# Patient Record
Sex: Female | Born: 1990 | Race: White | Hispanic: No | Marital: Single
Health system: Midwestern US, Academic
[De-identification: ages and names within clinical notes are randomized; demographics above are authoritative.]

---

## 2018-12-03 ENCOUNTER — Encounter: Admit: 2018-12-03 | Discharge: 2018-12-03

## 2018-12-03 DIAGNOSIS — R748 Abnormal levels of other serum enzymes: Secondary | ICD-10-CM

## 2018-12-03 DIAGNOSIS — K7469 Other cirrhosis of liver: Secondary | ICD-10-CM

## 2018-12-03 DIAGNOSIS — R1011 Right upper quadrant pain: Secondary | ICD-10-CM

## 2018-12-03 DIAGNOSIS — K7290 Hepatic failure, unspecified without coma: Secondary | ICD-10-CM

## 2018-12-03 LAB — OPIATES-URINE RANDOM: Lab: NEGATIVE

## 2018-12-03 LAB — CBC AND DIFF
Lab: 0 10*3/uL (ref 0–0.20)
Lab: 0.1 10*3/uL (ref 0–0.45)
Lab: 0.6 10*3/uL (ref 0–0.80)
Lab: 2.1 10*3/uL (ref 1.8–7.0)
Lab: 3 % (ref 0–5)
Lab: 37 % — ABNORMAL HIGH (ref 36–45)
Lab: 4.4 M/UL — ABNORMAL LOW (ref 4.0–5.0)
Lab: 41 % (ref 24–44)
Lab: 5.1 10*3/uL — ABNORMAL HIGH (ref 4.5–11.0)

## 2018-12-03 LAB — AMPHETAMINES-URINE RANDOM: Lab: POSITIVE — AB

## 2018-12-03 LAB — LDH-LACTATE DEHYDROGENASE: Lab: 293 U/L — ABNORMAL HIGH (ref 100–210)

## 2018-12-03 LAB — ALCOHOL LEVEL: Lab: <10 mg/dL

## 2018-12-03 LAB — PROTIME INR (PT): Lab: 1.1 % (ref 0.8–1.2)

## 2018-12-03 LAB — BENZODIAZEPINES-URINE RANDOM: Lab: POSITIVE — AB

## 2018-12-03 LAB — HEPATITIS B CORE AB TOT (IGG+IGM)

## 2018-12-03 LAB — ACETAMINOPHEN LEVEL: Lab: <10 ug/mL (ref <20.1)

## 2018-12-03 LAB — MAGNESIUM: Lab: 1.7 mg/dL — ABNORMAL LOW (ref >40)

## 2018-12-03 LAB — HEPATITIS PANEL, ACUTE

## 2018-12-03 LAB — COMPREHENSIVE METABOLIC PANEL
Lab: 0.5 mg/dL (ref 0.4–1.00)
Lab: 110 mg/dL — ABNORMAL HIGH (ref 70–100)
Lab: 136 MMOL/L — ABNORMAL LOW (ref 137–147)
Lab: 4 MMOL/L (ref 3.5–5.1)
Lab: 6.2 g/dL (ref 6.0–8.0)

## 2018-12-03 LAB — LIPID PROFILE
Lab: 121 mg/dL (ref 7–11)
Lab: 130 mg/dL (ref <200)
Lab: 145 mg/dL — ABNORMAL HIGH (ref <150)
Lab: 82 mg/dL — ABNORMAL HIGH (ref <100)

## 2018-12-03 LAB — CANNABINOIDS-URINE RANDOM: Lab: NEGATIVE

## 2018-12-03 LAB — PHENCYCLIDINES-URINE RANDOM: Lab: NEGATIVE

## 2018-12-03 LAB — URINALYSIS DIPSTICK
Lab: NEGATIVE
Lab: NEGATIVE
Lab: NEGATIVE
Lab: POSITIVE — AB

## 2018-12-03 LAB — HEMOGLOBIN A1C: Lab: 5.7 % (ref 4.0–6.0)

## 2018-12-03 LAB — SALICYLATE LEVEL

## 2018-12-03 LAB — BILIRUBIN, DIRECT: Lab: 3 mg/dL — ABNORMAL HIGH (ref <0.4)

## 2018-12-03 LAB — PHOSPHORUS: Lab: 3.2 mg/dL (ref >60)

## 2018-12-03 LAB — HIV 1& 2 AG-AB SCRN W REFLEX HIV 1 PCR QUANT

## 2018-12-03 LAB — BARBITURATES-URINE RANDOM: Lab: NEGATIVE

## 2018-12-03 LAB — COVID-19 (SARS-COV-2) PCR

## 2018-12-03 LAB — HEPATITIS A TOTAL AB (IGG+IGM)

## 2018-12-03 LAB — COCAINE-URINE RANDOM: Lab: NEGATIVE

## 2018-12-03 LAB — URINALYSIS, MICROSCOPIC

## 2018-12-03 LAB — TSH WITH FREE T4 REFLEX: Lab: 1.9 uU/mL (ref 0.35–5.00)

## 2018-12-03 MED ORDER — OXYCODONE 5 MG PO TAB
5-10 mg | ORAL | 0 refills | Status: DC | PRN
Start: 2018-12-03 — End: 2018-12-06
  Administered 2018-12-04 – 2018-12-06 (×13): 10 mg via ORAL

## 2018-12-03 MED ORDER — CEFTRIAXONE INJ 2GM IVP
2 g | INTRAVENOUS | 0 refills | Status: DC
Start: 2018-12-03 — End: 2018-12-04
  Administered 2018-12-03 – 2018-12-04 (×2): 2 g via INTRAVENOUS

## 2018-12-03 MED ORDER — OXYCODONE 5 MG PO TAB
5 mg | ORAL | 0 refills | Status: DC | PRN
Start: 2018-12-03 — End: 2018-12-04
  Administered 2018-12-03 (×2): 5 mg via ORAL

## 2018-12-03 MED ORDER — TRAMADOL 50 MG PO TAB
50 mg | ORAL | 0 refills | Status: DC | PRN
Start: 2018-12-03 — End: 2018-12-03
  Administered 2018-12-03 (×2): 50 mg via ORAL

## 2018-12-03 MED ORDER — MELATONIN 3 MG PO TAB
3 mg | Freq: Every evening | ORAL | 0 refills | Status: DC | PRN
Start: 2018-12-03 — End: 2018-12-06

## 2018-12-03 MED ORDER — ENOXAPARIN 40 MG/0.4 ML SC SYRG
40 mg | Freq: Every day | SUBCUTANEOUS | 0 refills | Status: DC
Start: 2018-12-03 — End: 2018-12-06
  Administered 2018-12-03 – 2018-12-06 (×4): 40 mg via SUBCUTANEOUS

## 2018-12-03 MED ORDER — ONDANSETRON HCL 4 MG PO TAB
4 mg | ORAL | 0 refills | Status: DC | PRN
Start: 2018-12-03 — End: 2018-12-06
  Administered 2018-12-04 – 2018-12-05 (×2): 4 mg via ORAL

## 2018-12-03 MED ORDER — DIPHENHYDRAMINE HCL 25 MG PO CAP
25-50 mg | ORAL | 0 refills | Status: DC | PRN
Start: 2018-12-03 — End: 2018-12-04
  Administered 2018-12-04 (×2): 25 mg via ORAL

## 2018-12-03 MED ORDER — ONDANSETRON HCL (PF) 4 MG/2 ML IJ SOLN
4 mg | INTRAVENOUS | 0 refills | Status: DC | PRN
Start: 2018-12-03 — End: 2018-12-06
  Administered 2018-12-03: 14:00:00 4 mg via INTRAVENOUS

## 2018-12-03 MED ORDER — PANTOPRAZOLE 40 MG PO TBEC
40 mg | Freq: Every day | ORAL | 0 refills | Status: DC
Start: 2018-12-03 — End: 2018-12-06
  Administered 2018-12-03 – 2018-12-06 (×4): 40 mg via ORAL

## 2018-12-03 MED ORDER — IBUPROFEN 400 MG PO TAB
400 mg | Freq: Once | ORAL | 0 refills | Status: DC
Start: 2018-12-03 — End: 2018-12-03

## 2018-12-03 NOTE — Progress Notes
Patient arrived to room # (HC510) via cart accompanied by transport. Patient transferred to the bed without assistance. Bedside safety checks completed. Initial patient assessment completed. Refer to flowsheet for details.    Admission skin assessment completed with: Mendel Ryder, RN    Pressure injury present on arrival?: No    1. Head/Face/Neck: No  2. Trunk/Back: No  3. Upper Extremities: No  4. Lower Extremities: No  5. Pelvic/Coccyx: No  6. Assessed for device associated injury? Yes  7. Malnutrition Screening Tool (Nursing Nutrition Assessment) Completed? Yes    See Doc Flowsheet for additional wound details.     INTERVENTIONS:
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
## 2018-12-03 NOTE — Care Coordination-Inpatient
For any questions prior to 8am please call Team Med Private Night 4, pager [phone]. After 8am contact Team Med Private F, pager [phone]
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
## 2018-12-04 LAB — CBC AND DIFF: Lab: 4.7 K/UL — ABNORMAL LOW (ref 4.5–11.0)

## 2018-12-04 LAB — COMPREHENSIVE METABOLIC PANEL
Lab: 137 MMOL/L — ABNORMAL LOW (ref >60)
Lab: 213 U/L — ABNORMAL HIGH (ref 25–110)
Lab: 3.3 mg/dL — ABNORMAL HIGH (ref >60)
Lab: 3.4 g/dL — ABNORMAL LOW (ref >60)
Lab: 6.6 g/dL — ABNORMAL LOW (ref 6.0–8.0)
Lab: 8 mg/dL — ABNORMAL HIGH (ref 7–25)
Lab: 8.3 mg/dL — ABNORMAL LOW (ref >60)

## 2018-12-04 LAB — PROTIME INR (PT): Lab: 1 M/UL — ABNORMAL HIGH (ref 0.8–1.2)

## 2018-12-04 LAB — ANTI-SMOOTH MUSCLE AB: Lab: <20 {titer} (ref <20)

## 2018-12-04 MED ORDER — SENNOSIDES-DOCUSATE SODIUM 8.6-50 MG PO TAB
1 | Freq: Two times a day (BID) | ORAL | 0 refills | Status: DC
Start: 2018-12-04 — End: 2018-12-06
  Administered 2018-12-04 – 2018-12-06 (×5): 1 via ORAL

## 2018-12-04 MED ORDER — FLUCONAZOLE 150 MG PO TAB
150 mg | Freq: Once | ORAL | 0 refills | Status: CP
Start: 2018-12-04 — End: ?
  Administered 2018-12-04: 18:00:00 150 mg via ORAL

## 2018-12-04 MED ORDER — HYDROXYZINE HCL 25 MG PO TAB
25-50 mg | ORAL | 0 refills | Status: DC | PRN
Start: 2018-12-04 — End: 2018-12-05
  Administered 2018-12-04 (×2): 50 mg via ORAL
  Administered 2018-12-05: 01:00:00 25 mg via ORAL
  Administered 2018-12-05: 11:00:00 50 mg via ORAL

## 2018-12-04 NOTE — Progress Notes
Brief Hepatology Progress Note    Patient reports feeling about the same. Still fatigued. Tolerating some PO. Some abdominal pain worst in RUQ.     Acute Hepatitis  -Hx of recent IVDU with needle sharing  -LFTs mildly improved today  -HCV Ab positive, viral load pending  -HepA/B non-reactive  -ASMA <20, ANA and IgG levels pending  -APAP undetectable  -EBV and CMV PCR pending    Recommendations:  -Follow up pending studies including HCV viral load  -If consistent with acute Hep C, will plan to follow up in 6 months to evaluate for spontaneous clearance. Would initiate treatment at that time if ongoing viremia.  -Substance use counseling/treatment, will have hepatology SW meet with patient.    Discussed with Dr. Gay Filler, MD  GI Fellow  Pager [phone]

## 2018-12-04 NOTE — Progress Notes
1600- Patient called out stating that her IV fell out. Patient stated that when getting out of bed her IV got caught on sheets and fell out. IV therapy able to place a new IV. IV wrapped with coban dressing.

## 2018-12-04 NOTE — Progress Notes
General Progress Note      Admission Date: 12/03/2018  LOS: 1 day                     Assessment/Plan:    Principal Problem:    Abnormal AST and ALT  Active Problems:    Abdominal pain    Acute hepatitis C virus infection without hepatic coma    28 y.o. moderately obese female known of IVDU  transferred to [place] for management of decompensated liver failure  ???  Elevtaed Transaminases   - reports a month long hx of abdominal pain that progressed over the past few days  - associated with distension, nausea and non bloody vomiting   - reports hx of polysubstance IV drug use mainly Meth per pt with confirmed needle sharing UDS both amphetamines and BZD's  - labs at the OSH WBC-6.5 HGB-13.3 HCT-45.8 Plt ct-223 INR-1.1 NA-138 K-4.7 CL-101 CO2-26 BUN-12 Crt-0.76 BS-96, T-bili-5.25 AST-957 ALT-1497 Amylase-34 Lipase-10  - MELD - Na Score 14  - CT abd/pelvis at OSH - negative for acute pathology   - U/S on presentation borderline hepatomegaly nrml doppler flow in hepatic vessels no ascites  - UA nrml  Bl cx no grpwth  - A1c 5.7, TSH , lipid panel nrml   - hepatitis panel - HCV reactive ,  HIV negative   - Alcohol levels , check tylenol and salicylate levels all negative   - Transaminases stable not rising currently AST 705, ALT 965 with INR 1.0  - Hepatology following , continue trending transaminases and INR   - PPI - protonix , IV antiemetics prn     HCV   -HCV reactive   -Will follow up PCR   -Hepatology following , will monitor in outpt no active treatment   ???  FEN  - IVF - none   - electrolytes stable   - regular diet   ???  PPx: SCDs , lovenox   ???  Code Status: Full Code   ???  Dispo:Will maintain inpt , this encounter took 35 minutes review chart labs radiology examining pt and counseling on plan of care with 20 mins spent face to face with pt   _________________________________________________________________________________________________________________________________________________________    Subjective No acute overnight issues , has generalized abdominal pain , no overnight fevers or chills     Medications  Scheduled Meds:cefTRIAXone (ROCEPHIN) IVP 2 g, 2 g, Intravenous, Q24H*  enoxaparin (LOVENOX) syringe 40 mg, 40 mg, Subcutaneous, QDAY(21)  pantoprazole DR (PROTONIX) tablet 40 mg, 40 mg, Oral, QDAY(21)  senna/docusate (SENOKOT-S) tablet 1 tablet, 1 tablet, Oral, BID    Continuous Infusions:  PRN and Respiratory Meds:hydrOXYzine Q6H PRN, melatonin QHS PRN, ondansetron Q6H PRN **OR** ondansetron (ZOFRAN) IV Q6H PRN, oxyCODONE Q4H PRN      Objective                       Vital Signs: Last Filed                 Vital Signs: 24 Hour Range   BP: 98/56 (06/08 0725)  Temp: 36.4 ???C (97.6 ???F) (06/08 0725)  Pulse: 81 (06/08 0725)  Respirations: 14 PER MINUTE (06/08 0725)  SpO2: 98 % (06/08 0725) BP: (92-98)/(48-56)   Temp:  [36.4 ???C (97.6 ???F)-36.6 ???C (97.9 ???F)]   Pulse:  [64-81]   Respirations:  [14 PER MINUTE-18 PER MINUTE]   SpO2:  [96 %-98 %]    Intensity Pain Scale (Self Report):  8 (12/04/18 0802) Vitals:    12/03/18 0454   Weight: 83.5 kg (184 lb)       Intake/Output Summary:  (Last 24 hours)    Intake/Output Summary (Last 24 hours) at 12/04/2018 1141  Last data filed at 12/04/2018 1058  Gross per 24 hour   Intake 762 ml   Output 450 ml   Net 312 ml           Physical Exam  General appearance: alert, well-developed, cooperative and moderately obese  Neurologic: Grossly normal  Lungs: clear to auscultation bilaterally  Heart: regular rate and rhythm, S1, S2 normal, no murmur, click, rub or gallop  Abdomen: not distrended , soft , obese with nrml bowel sounds .  Extremities: extremities normal, atraumatic, no cyanosis or edema     Lab Review  CBC w/Diff    Lab Results   Component Value Date/Time    WBC 4.7 12/04/2018 04:23 AM    RBC 4.71 12/04/2018 04:23 AM    HGB 12.9 12/04/2018 04:23 AM    HCT 38.9 12/04/2018 04:23 AM    MCV 82.6 12/04/2018 04:23 AM    MCH 27.3 12/04/2018 04:23 AM    MCHC 33.0 12/04/2018 04:23 AM RDW 16.1 (H) 12/04/2018 04:23 AM    PLTCT 260 12/04/2018 04:23 AM    MPV 9.3 12/04/2018 04:23 AM    Lab Results   Component Value Date/Time    NEUT 32 (L) 12/04/2018 04:23 AM    ANC 1.51 (L) 12/04/2018 04:23 AM    LYMA 51 (H) 12/04/2018 04:23 AM    ALC 2.40 12/04/2018 04:23 AM    MONA 11 12/04/2018 04:23 AM    AMC 0.53 12/04/2018 04:23 AM    EOSA 5 12/04/2018 04:23 AM    AEC 0.22 12/04/2018 04:23 AM    BASA 1 12/04/2018 04:23 AM    ABC 0.03 12/04/2018 04:23 AM        Comprehensive Metabolic Profile    Lab Results   Component Value Date/Time    NA 137 12/04/2018 04:23 AM    K 4.6 12/04/2018 04:23 AM    CL 104 12/04/2018 04:23 AM    CO2 26 12/04/2018 04:23 AM    GAP 7 12/04/2018 04:23 AM    BUN 8 12/04/2018 04:23 AM    CR 0.60 12/04/2018 04:23 AM    GLU 99 12/04/2018 04:23 AM    Lab Results   Component Value Date/Time    CA 8.3 (L) 12/04/2018 04:23 AM    PO4 3.2 12/03/2018 10:31 AM    ALBUMIN 3.4 (L) 12/04/2018 04:23 AM    TOTPROT 6.6 12/04/2018 04:23 AM    ALKPHOS 213 (H) 12/04/2018 04:23 AM    AST 705 (H) 12/04/2018 04:23 AM    ALT 965 (H) 12/04/2018 04:23 AM    TOTBILI 3.3 (H) 12/04/2018 04:23 AM    GFR >60 12/04/2018 04:23 AM    GFRAA >60 12/04/2018 04:23 AM          Point of Care Testing  (Last 24 hours)  Glucose: 99    Radiology and other Diagnostics Review:        Abd U/S - Impression     1. ???Borderline hepatomegaly. No frank morphologic features of cirrhosis.    2. ???Mild splenomegaly.    3. ???Patent hepatic vessels with normal direction of blood flow.    4. ???No ascites.      Guy Sandifer Ayliana Casciano, MD   Med Private F  Pg [phone]

## 2018-12-05 LAB — ANTI-NUCLEAR AB(ANA)-QUANT: Lab: 320 — ABNORMAL HIGH (ref <80)

## 2018-12-05 LAB — HEPATITIS BE ANTIGEN: Lab: NEGATIVE

## 2018-12-05 LAB — CBC AND DIFF: Lab: 5.4 K/UL — ABNORMAL LOW (ref >60)

## 2018-12-05 LAB — CMV QUANT PCR-BLOOD: Lab: <50

## 2018-12-05 LAB — ANTI-NUCLEAR ANTIBODY(ANA)

## 2018-12-05 LAB — PROTIME INR (PT): Lab: 1 MMOL/L (ref 0.8–1.2)

## 2018-12-05 LAB — HEPATITIS BE ANTIBODY: Lab: NEGATIVE

## 2018-12-05 LAB — COMPREHENSIVE METABOLIC PANEL: Lab: 136 MMOL/L — ABNORMAL LOW (ref >60)

## 2018-12-05 MED ORDER — CAMPHOR-MENTHOL 0.5-0.5 % TP LOTN
Freq: Three times a day (TID) | TOPICAL | 0 refills | Status: DC | PRN
Start: 2018-12-05 — End: 2018-12-06
  Administered 2018-12-06: 16:00:00 222.000 mL via TOPICAL

## 2018-12-05 MED ORDER — POLYETHYLENE GLYCOL 3350 17 GRAM PO PWPK
1 | Freq: Two times a day (BID) | ORAL | 0 refills | Status: DC
Start: 2018-12-05 — End: 2018-12-06
  Administered 2018-12-05 – 2018-12-06 (×3): 17 g via ORAL

## 2018-12-05 MED ORDER — CALCIUM CARBONATE 200 MG CALCIUM (500 MG) PO CHEW
500 mg | ORAL | 0 refills | Status: DC | PRN
Start: 2018-12-05 — End: 2018-12-06
  Administered 2018-12-05 (×2): 500 mg via ORAL

## 2018-12-05 MED ORDER — HYDROXYZINE HCL 25 MG PO TAB
25-50 mg | ORAL | 0 refills | Status: DC | PRN
Start: 2018-12-05 — End: 2018-12-06
  Administered 2018-12-05 – 2018-12-06 (×4): 50 mg via ORAL

## 2018-12-05 NOTE — Progress Notes
General Progress Note      Admission Date: 12/03/2018  LOS: 2 days                     Assessment/Plan:    Principal Problem:    Abnormal AST and ALT  Active Problems:    Abdominal pain    Acute hepatitis C virus infection without hepatic coma    28 y.o. moderately obese female known of IVDU  transferred to [place] for management of decompensated liver failure  ???  Elevtaed Transaminases   - reports a month long hx of abdominal pain that progressed over the past few days  - associated with distension, nausea and non bloody vomiting   - reports hx of polysubstance IV drug use mainly Meth per pt with confirmed needle sharing UDS both amphetamines and BZD's  - labs at the OSH WBC-6.5 HGB-13.3 HCT-45.8 Plt ct-223 INR-1.1 NA-138 K-4.7 CL-101 CO2-26 BUN-12 Crt-0.76 BS-96, T-bili-5.25 AST-957 ALT-1497 Amylase-34 Lipase-10  - MELD - Na Score 14  - CT abd/pelvis at OSH - negative for acute pathology   - U/S on presentation borderline hepatomegaly nrml doppler flow in hepatic vessels no ascites  - UA nrml  Bl cx no grpwth  - A1c 5.7, TSH , lipid panel nrml   - hepatitis panel - HCV reactive ,  HIV negative   - Alcohol levels , check tylenol and salicylate levels all negative   - Transaminases stable not rising currently AST 705, ALT 965 with INR 1.0  - Hepatology following , transaminases slowly trending down INR remains stable  - PPI - protonix , IV antiemetics prn     HCV   -HCV reactive   -Will follow up PCR   -Hepatology following , will monitor in outpt no active treatment   ???  FEN  - IVF - none   - electrolytes stable   - regular diet   ???  PPx: SCDs , lovenox   ???  Code Status: Full Code   ???  Dispo:Will maintain inpt , this encounter took 35 minutes review chart labs radiology examining pt and counseling on plan of care with 20 mins spent face to face with pt   _________________________________________________________________________________________________________________________________________________________ Subjective  Stable this a.m. abdominal pain persists though slightly better controlled has a generalized age    Medications  Scheduled Meds:enoxaparin (LOVENOX) syringe 40 mg, 40 mg, Subcutaneous, QDAY(21)  pantoprazole DR (PROTONIX) tablet 40 mg, 40 mg, Oral, QDAY(21)  senna/docusate (SENOKOT-S) tablet 1 tablet, 1 tablet, Oral, BID    Continuous Infusions:  PRN and Respiratory Meds:calcium carbonate Q4H PRN, hydrOXYzine Q6H PRN, melatonin QHS PRN, ondansetron Q6H PRN **OR** ondansetron (ZOFRAN) IV Q6H PRN, oxyCODONE Q4H PRN      Objective                       Vital Signs: Last Filed                 Vital Signs: 24 Hour Range   BP: 96/58 (06/09 0805)  Temp: 36.6 ???C (97.8 ???F) (06/09 0805)  Pulse: 71 (06/09 0805)  Respirations: 18 PER MINUTE (06/09 0805)  SpO2: 100 % (06/09 0805) BP: (96-103)/(51-58)   Temp:  [36.6 ???C (97.8 ???F)-36.9 ???C (98.4 ???F)]   Pulse:  [71-89]   Respirations:  [14 PER MINUTE-18 PER MINUTE]   SpO2:  [96 %-100 %]    Intensity Pain Scale (Self Report): 8 (12/05/18 0932) Vitals:    12/03/18 1610  Weight: 83.5 kg (184 lb)       Intake/Output Summary:  (Last 24 hours)    Intake/Output Summary (Last 24 hours) at 12/05/2018 1039  Last data filed at 12/05/2018 1610  Gross per 24 hour   Intake 2168 ml   Output 800 ml   Net 1368 ml      Stool Occurrence: 0    Physical Exam  General appearance: alert, well-developed, cooperative and moderately obese  Neurologic: Grossly normal  Lungs: clear to auscultation bilaterally  Heart: regular rate and rhythm, S1, S2 normal, no murmur, click, rub or gallop  Abdomen: not distrended , soft , obese with nrml bowel sounds .  Extremities: extremities normal, atraumatic, no cyanosis or edema     Lab Review  CBC w/Diff    Lab Results   Component Value Date/Time    WBC 5.4 12/05/2018 05:10 AM    RBC 4.45 12/05/2018 05:10 AM    HGB 12.4 12/05/2018 05:10 AM    HCT 37.3 12/05/2018 05:10 AM    MCV 83.7 12/05/2018 05:10 AM    MCH 27.9 12/05/2018 05:10 AM MCHC 33.4 12/05/2018 05:10 AM    RDW 16.5 (H) 12/05/2018 05:10 AM    PLTCT 233 12/05/2018 05:10 AM    MPV 8.8 12/05/2018 05:10 AM    Lab Results   Component Value Date/Time    NEUT 40 (L) 12/05/2018 05:10 AM    ANC 2.17 12/05/2018 05:10 AM    LYMA 46 (H) 12/05/2018 05:10 AM    ALC 2.49 12/05/2018 05:10 AM    MONA 10 12/05/2018 05:10 AM    AMC 0.54 12/05/2018 05:10 AM    EOSA 3 12/05/2018 05:10 AM    AEC 0.17 12/05/2018 05:10 AM    BASA 1 12/05/2018 05:10 AM    ABC 0.05 12/05/2018 05:10 AM        Comprehensive Metabolic Profile    Lab Results   Component Value Date/Time    NA 136 (L) 12/05/2018 05:10 AM    K 4.5 12/05/2018 05:10 AM    CL 101 12/05/2018 05:10 AM    CO2 29 12/05/2018 05:10 AM    GAP 6 12/05/2018 05:10 AM    BUN 11 12/05/2018 05:10 AM    CR 0.67 12/05/2018 05:10 AM    GLU 89 12/05/2018 05:10 AM    Lab Results   Component Value Date/Time    CA 8.5 12/05/2018 05:10 AM    PO4 3.2 12/03/2018 10:31 AM    ALBUMIN 3.3 (L) 12/05/2018 05:10 AM    TOTPROT 6.4 12/05/2018 05:10 AM    ALKPHOS 197 (H) 12/05/2018 05:10 AM    AST 444 (H) 12/05/2018 05:10 AM    ALT 738 (H) 12/05/2018 05:10 AM    TOTBILI 2.2 (H) 12/05/2018 05:10 AM    GFR >60 12/05/2018 05:10 AM    GFRAA >60 12/05/2018 05:10 AM          Point of Care Testing  (Last 24 hours)  Glucose: 89    Radiology and other Diagnostics Review:        Abd U/S - Impression     1. ???Borderline hepatomegaly. No frank morphologic features of cirrhosis.    2. ???Mild splenomegaly.    3. ???Patent hepatic vessels with normal direction of blood flow.    4. ???No ascites.      Guy Sandifer Whalen Trompeter, MD   Med Private F  Pg [phone]

## 2018-12-05 NOTE — Care Plan
All medications explained prior to administration, patient verbally stated she understood. Patient denied questions/concerns. Will continue to monitor.
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
## 2018-12-06 ENCOUNTER — Ambulatory Visit
Admit: 2018-12-03 | Discharge: 2018-12-06 | Disposition: A | Discharge status: Home / Self Care | Source: Other Acute Inpatient Hospital

## 2018-12-06 ENCOUNTER — Encounter: Admit: 2018-12-06 | Discharge: 2018-12-06

## 2018-12-06 ENCOUNTER — Encounter: Admit: 2018-12-03 | Discharge: 2018-12-03

## 2018-12-06 DIAGNOSIS — Z6837 Body mass index (BMI) 37.0-37.9, adult: Secondary | ICD-10-CM

## 2018-12-06 DIAGNOSIS — L299 Pruritus, unspecified: Secondary | ICD-10-CM

## 2018-12-06 DIAGNOSIS — R5383 Other fatigue: Secondary | ICD-10-CM

## 2018-12-06 DIAGNOSIS — Z885 Allergy status to narcotic agent status: Secondary | ICD-10-CM

## 2018-12-06 DIAGNOSIS — B1710 Acute hepatitis C without hepatic coma: Secondary | ICD-10-CM

## 2018-12-06 DIAGNOSIS — R112 Nausea with vomiting, unspecified: Secondary | ICD-10-CM

## 2018-12-06 DIAGNOSIS — Z1159 Encounter for screening for other viral diseases: Secondary | ICD-10-CM

## 2018-12-06 DIAGNOSIS — K7589 Other specified inflammatory liver diseases: Secondary | ICD-10-CM

## 2018-12-06 DIAGNOSIS — R161 Splenomegaly, not elsewhere classified: Secondary | ICD-10-CM

## 2018-12-06 DIAGNOSIS — K7290 Hepatic failure, unspecified without coma: Secondary | ICD-10-CM

## 2018-12-06 DIAGNOSIS — Z882 Allergy status to sulfonamides status: Secondary | ICD-10-CM

## 2018-12-06 LAB — EBV DNA, PCR QUANT BLOOD

## 2018-12-06 LAB — CBC AND DIFF: Lab: 5.6 K/UL — ABNORMAL LOW (ref 4.5–11.0)

## 2018-12-06 LAB — COMPREHENSIVE METABOLIC PANEL: Lab: 137 MMOL/L — ABNORMAL HIGH (ref >60)

## 2018-12-06 LAB — HEPATITIS C VIRAL LOAD PCR QUANT: Lab: 190 [IU]/mL

## 2018-12-06 LAB — PROTIME INR (PT): Lab: 0.9 ug/dL — ABNORMAL LOW (ref >60)

## 2018-12-06 LAB — HEPATITIS B SURFACE AB: Lab: NEGATIVE

## 2018-12-06 MED ORDER — SENNOSIDES-DOCUSATE SODIUM 8.6-50 MG PO TAB
1 | ORAL_TABLET | Freq: Two times a day (BID) | ORAL | 0 refills | Status: AC
Start: 2018-12-06 — End: ?

## 2018-12-06 MED ORDER — POLYETHYLENE GLYCOL 3350 17 GRAM PO PWPK
17 g | Freq: Two times a day (BID) | ORAL | 0 refills | 18.00000 days | Status: AC
Start: 2018-12-06 — End: ?

## 2018-12-06 MED ORDER — HYDROXYZINE HCL 25 MG PO TAB
25-50 mg | ORAL_TABLET | ORAL | 0 refills | 30.00000 days | Status: AC | PRN
Start: 2018-12-06 — End: ?

## 2018-12-06 MED ORDER — OXYCODONE 5 MG PO TAB
5-10 mg | ORAL_TABLET | ORAL | 0 refills | 6.00000 days | Status: AC | PRN
Start: 2018-12-06 — End: ?

## 2018-12-06 NOTE — Care Plan
Problem: Nausea/Vomiting  Goal: Absence of nausea and/or vomiting  Outcome: Goal Achieved  Flowsheets (Taken 12/06/2018 1344)  Absence of nausea and/or vomiting:   Assess for signs and symptoms of dehydration   Manage nausea   Consider IV fluid administration   Perform oral hygiene   Monitor electrolytes   Monitor intake and output   Provide calming techniques education   Provide non-pharmacologic nausea management education  Goal: Adequate nutritional intake  Outcome: Goal Achieved  Flowsheets (Taken 12/06/2018 1344)  Adequate nutritional intake:   Assess dietary preferences   Promote oral fluid intake   Knowledge of nutritional diet     Problem: Anxiety  Goal: Alleviation of anxiety  Outcome: Goal Achieved  Flowsheets (Taken 12/06/2018 1344)  Alleviation of Anxiety:   Assess coping style   Provide coping support to patient/caregiver   Provide comfort promotion interventions   Provide relaxation techniques education     Problem: Discharge Planning  Goal: Participation in plan of care  Outcome: Goal Achieved  Flowsheets (Taken 12/06/2018 1344)  Participation in Plan of Care: Involve patient/caregiver in care planning decision making  Goal: Knowledge regarding plan of care  Outcome: Goal Achieved  Flowsheets (Taken 12/06/2018 1344)  Knowledge regarding plan of care:   Provide admission education to parent/caregiver   Provide plan of care education   Provide procedural and treatment education   Provide infection prevention education   Provide medication management education  Goal: Prepared for discharge  Outcome: Goal Achieved  Flowsheets (Taken 12/06/2018 1344)  Prepared for discharge:   Complete ADL ability assessment   Collaborate with multidisciplinary team for hospital discharge coordination   Provide safe use medical equipment education   Provide diet and oral health education   Provide discharge activity restrictions education   Provide discharge materials appropriate to patient condition Problem: Infection, Risk of  Goal: Absence of infection  Outcome: Goal Achieved  Flowsheets (Taken 12/06/2018 1344)  Absence of infection:   Assess for infection (Monitor SIRS Criteria)   Monitor for signs and symptoms of infection  Goal: Knowledge of Infection Control Procedures  Outcome: Goal Achieved

## 2018-12-06 NOTE — Care Plan
All medications explained prior to administration and patient verbalized understanding. Patient denied questions/concerns while reviewing their POC. Will continue to monitor.     Problem: Nausea/Vomiting  Goal: Absence of nausea and/or vomiting  Outcome: Goal Ongoing  Goal: Adequate nutritional intake  Outcome: Goal Ongoing     Problem: Anxiety  Goal: Alleviation of anxiety  Outcome: Goal Ongoing     Problem: Discharge Planning  Goal: Participation in plan of care  Outcome: Goal Ongoing  Goal: Knowledge regarding plan of care  Outcome: Goal Ongoing  Goal: Prepared for discharge  Outcome: Goal Ongoing     Problem: Infection, Risk of  Goal: Absence of infection  Outcome: Goal Ongoing  Goal: Knowledge of Infection Control Procedures  Outcome: Goal Ongoing

## 2018-12-06 NOTE — Progress Notes
Discharge paperwork gone over with patient. Patient wants to know about if needs printed scripts for narcotics. Dr. Iantha Fallen paged. Stated that it is transmitted electronically. IV removed. Patient packing up all belongings. Will continue to monitor.

## 2018-12-06 NOTE — Progress Notes
Plan: provide counseling resources    Intervention:  OLT Sw requested to meet with pt and provide counseling resources prior to discharge. SW reviewed EMR. Pt has history of IV drug use.  SW research resources for pt.  SW met with pt to discuss resources. Pt states she just woke up. She states she will have transportation home. Pt states she is not going to go back to the same home but then later said she was. She states "I just woke up, I don't know, I will figure it out."  SW discussed resources provided, local [facility] resources, and resources for [place],  area.  SW provided documentation log form.  Sw provided her contact information for future questions/concerns.  SW answered all questions and requested pt call if she has questions/concerns.  Hepatology team updated.  Eugene Garnet, LMSW
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
## 2018-12-06 NOTE — Progress Notes
Patient walked to lobby with significant other. Has all belongings with her.
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
## 2018-12-06 NOTE — Case Management (ED)
Case Management Admission Assessment    NAME:Kathy Matthews                          MRN: [id number]             DOB:Mar 28, 1991          AGE: 28 y.o.  ADMISSION DATE: 12/03/2018             DAYS ADMITTED: LOS: 3 days      Today???s Date: 12/06/2018    Source of Information: Pt       Plan: Pt to dc home w/ SA resources via transport provided by pt's daughter.   Plan: Case Management Assessment, Discharge Planning for Home Anticipated     Kathy Matthews is a 28 y.o. morbidly obese female with pmhx of IV drug use who is transferred to [facility] for management of decompensated liver failure    -Pt lives in a one story home w/ no STE. She uses tub/shower.  -Pt is independent with self-care and ADLs PTA. Her BF is able to assist if she needs anything and is available for 24/7 care.  -DME: none  -Placement hx: none  -Pt has Rx insurance and reports no issues affording medications at this time. Pt has [place] Medicaid  -MH: manic/depressive, bipolar but isn't seeing anyone. She declined MH resources  -SA: IV meth and heroin use- she stated that she would like resources to look over and start after hospital. Occasional alcohol use.   -Transportation to be provided by family.     Patient Address/Phone  [address]  [place] [address]  [phone] (home)     Emergency Contact  Extended Emergency Contact Information  Primary Emergency Contact: Noah Charon  Home Phone: [phone]  Relation: Step Parent    Healthcare Directive  Healthcare Directive: No, patient does not have a healthcare directive  Would patient like to fill out a (a new) Healthcare Directive?: Yes, referral to Social Work      Transportation  Does the patient need discharge transport arranged?: No    Expected Discharge Date  Expected Discharge Date: 12/06/18  Expected Discharge Time: 1500    Living Situation Prior to Admission  ? Living Arrangements  Type of Residence: Home, independent  Living Arrangements: Alone  Financial risk analyst / Tub: Tub/Shower Unit How many levels in the residence?: 1  Can patient live on one level if needed?: Yes  Does residence have entry and/or side stairs?: No  Assistance needed prior to admit or anticipated on discharge: Yes  Who provides assistance or could if needed?: Pt's BF  Are they in good health?: Yes  Can support system provide 24/7 care if needed?: Maybe  ? Level of Function   Prior level of function: Independent  ? Cognitive Abilities   Cognitive Abilities: Alert and Oriented    Financial Resources  ? Coverage  Primary Insurance: Medicaid  Secondary Insurance: No insurance  Additional Coverage: RX    ? Source of Income   Source Of Income: None  ? Financial Assistance Needed?  N/A    Psychosocial Needs  ? Mental Health  Mental Health History: Yes  Mental Health Symptoms: Other (comment)(Pt reports that she is bipolar and manic/depressive - not seeing anyone and declines resources)  ? Substance Use History  Substance Use History Screen: Yes  Comment: IV meth and herion use- agreeable to SA resources  ? Other  N/A    Current/Previous Services  ?  PCP  No Pcp, Na, None, None  ? Pharmacy    [facility] [phone] - [place], [place] - [address] AT [facility] [address] [facility] [address]  [address] [place] [address]  Phone: [phone] Fax: [phone]    ? Durable Medical Equipment   Durable Medical Equipment at home: None  ? Home Health  Receiving home health: No  ? Hemodialysis or Peritoneal Dialysis  Undergoing hemodialysis or peritoneal dialysis: No  ? Tube/Enteral Feeds  Receive tube/enteral feeds: No  ? Infusion  Receive infusions: No  ? Private Duty  Private duty help used: No  ? Home and Community Based Services  Home and community based services: No  ? [employer]: N/A  ? Hospice  Hospice: No  ? Outpatient Therapy  PT: No  OT: No  SLP: No  ? Skilled Nursing Facility/Nursing Home  SNF: No  NH: No  ? Inpatient Rehab  IPR: No  ? Long-Term Acute Care Hospital  LTACH: No  ? Acute Hospital Stay Acute Hospital Stay: In the past  Was patient's stay within the last 30 days?: No    Selena Lesser, LMSW  Pager: [phone]

## 2018-12-07 LAB — IGG SUBCLASSES
Lab: 307 mg/dL
Lab: 125
Lab: 729
Lab: 85

## 2018-12-08 ENCOUNTER — Encounter: Admit: 2018-12-08 | Discharge: 2018-12-08

## 2018-12-08 DIAGNOSIS — F1990 Other psychoactive substance use, unspecified, uncomplicated: Secondary | ICD-10-CM

## 2018-12-08 NOTE — Discharge Instructions - Pharmacy
Physician Discharge Summary      Name: Kathy Matthews  Medical Record Number: [id number]        Account Number:  [id number]  Date Of Birth:  06-Oct-1990                         Age:  28 years   Admit date:  12/03/2018                     Discharge date:  12/06/2018    Attending Physician:  Lowella Dell MD               Service: Med Private F- [id number]    Physician Summary completed by: Guy Sandifer Yu Peggs, MD     Reason for hospitalization: 28 y.o. morbidly obese female with pmhx of IV drug use who is transferred to [facility] for management of decompensated liver failure    Significant PMH:   Medical History:   Diagnosis Date   ??? Morbid obesity (HCC)    ??? Recreational drug use        Allergies: Bactrim [sulfamethoxazole-trimethoprim]; Fentanyl; Morphine; and Codeine    Admission Physical Exam notable for:     Vital Signs: Last Filed In 24 Hours Vital Signs: 24 Hour Range   BP: 120/71 (06/07 0514)  Temp: 36.6 ???C (97.8 ???F) (06/07 4540)  Pulse: 65 (06/07 0514)  Respirations: 14 PER MINUTE (06/07 0514) BP: (120)/(71)   Temp:  [36.6 ???C (97.8 ???F)]   Pulse:  [65]   Respirations:  [14 PER MINUTE]    General:  Alert, cooperative, no distress, appears stated age, appears jaundiced  Lungs:  Clear to auscultation bilaterally  Heart:   Regular rate and rhythm, S1, S2 normal, no murmur, click rub or gallop  Abdomen:  Soft, distended, tender to palpation in the mid abd quadrants, voluntary guarding appreciated.  Bowel sounds normal.  No masses.  Unable to assess organomegaly given body habitus  Extremities: Extremities normal, atraumatic, no cyanosis or edema  Neurologic:   No sensory or motor deficits     Admission Lab/Radiology studies notable for:  AST 617, AST 974, LDH 293    Brief Hospital Course:  28 y.o.???moderately obese???female???known of IVDU  transferred to [facility] for further work-up of elevated transaminases. Patient's had been experiencing progressive abdominal pain the preceding month associated with abdominal distention nausea and nonbloody emesis.  Patient did give a positive history of intravenous recreational drug use methamphetamine with needle sharing with other individuals using methamphetamine and UDS was positive for both amphetamines and benzodiazepines.  Transaminases were elevated to AST of 957 ALT of 1497 with a normal INR 1.1.  Meld score was 14 on presentation CT of the abdomen pelvis at the outside hospital was negative for any acute pathology.  On presentation ultrasound of the abdomen gave borderline hepatomegaly with normal Doppler flow in hepatic vessels and no ascites.  An acute hepatic panel was reactive for hepatitis C and a PCR was requested.  Patient seen by hepatology will be started emergently on treatment was monitored as transaminases trended down counseled on the importance of drug abstinence and will follow-up with hepatology in the outpatient.  She was discharged in stable condition with AST having dropped to 267 and ALT to 538.  She was discharged in stable condition    Condition at Discharge: Stable    Discharge Diagnoses:      Acute HCV Infection    Morbid Obesity  Transaminitis     Surgical Procedures: None    Significant Diagnostic Studies and Procedures: noted in brief hospital course    Consults:  Hepatology    Patient Disposition: Home       Patient instructions/medications:       Regular Diet    You have no dietary restriction. Please continue with a healthy balanced diet.     Report These Signs and Symptoms    Please contact your doctor if you have any of the following symptoms: temperature higher than 100.4 degrees F, persistent nausea and/or vomiting or severe abdominal pain     Questions About Your Stay    If you have an emergency after discharge, please dial [phone].    You may contact your discharging physician up to 7 days after discharge for questions about your hospitalization, discharge instructions, or medications by calling [phone] during regular business hours (8AM-4PM) and asking to speak to the doctor listed on the discharge information.       If you are not calling during business hours, ask for the on-call doctor.    If you have new  or worsening symptoms, you may be directed to your primary care provider (PCP) for ongoing questions, an Emergency Department, or an Urgent Care Clinic for a more immediate evaluation.    For all calls or questions more than 7 days after discharge, please contact your primary care provider (PCP).    For medications after discharge: pain (opioid) medicine cannot be refilled or prescribed by calling your discharging physician.  These medications need to be filled by your primary care provider (PCP).  Regular refill requests should be directed to your primary care provider (PCP).     Discharging attending physician: Brunetta Jeans [[id number]]      Activity as Tolerated    It is important to keep increasing your activity level after you leave the hospital.  Moving around can help prevent blood clots, lung infection (pneumonia) and other problems.  Gradually increasing the number of times you are up moving around will help you return to your normal activity level more quickly.  Continue to increase the number of times you are up to the chair and walking daily to return to your normal activity level. Begin to work toward your normal activity level at discharge     Return Appointment    Please use the numbers provided to establish Primary Care     Appointment Request: Hepatology    Needs 3 mo w/ AG. Clinic gen hep visit. Pls call pt. Mail map & card.     Diagnosis: HCV    Was the patient seen in hospital by the Hepatology consult service? Yes    Is patient established in the Hepatology clinic? No    Does the patient already have an appointment scheduled with Hepatology clinic? (If requesting sooner appt please indicate reasoning.) No Is this an Urgent Request needing sooner than (4 weeks follow up)? No    Pager # of the requesting provider if any questions? Number not on file       Current Discharge Medication List       START taking these medications    Details   hydrOXYzine (ATARAX) 25 mg tablet Take one tablet to two tablets by mouth every 4 hours as needed.  Qty: 30 tablet, Refills: 0    PRESCRIPTION TYPE:  Normal      oxyCODONE (ROXICODONE) 5 mg tablet Take one tablet to two tablets by mouth every  4 hours as needed  Qty: 20 tablet, Refills: 0    PRESCRIPTION TYPE:  Normal      polyethylene glycol 3350 (MIRALAX) 17 g packet Take one packet by mouth twice daily.  Qty: 12 each, Refills: 0    PRESCRIPTION TYPE:  Normal      senna/docusate (SENOKOT-S) 8.6/50 mg tablet Take one tablet by mouth twice daily.  Qty: 20 tablet, Refills: 0    PRESCRIPTION TYPE:  Normal          CONTINUE these medications which have NOT CHANGED    Details   levonorgestreL (MIRENA) 20 mcg/24 hours (5 yrs) 52 mg intrauterine device 1 Intra Uterine Device by Intrauterine route once.    PRESCRIPTION TYPE:  Historical Med               Pending items needing follow up: None    Signed:  Guy Sandifer Adalai Perl, MD  12/08/2018      cc:  Primary Care Physician:  No Pcp, Na   Verified  Referring physicians:  Lona Kettle, MD   Additional provider(s):

## 2018-12-09 LAB — HEPATITIS C GENOTYPE

## 2018-12-09 LAB — CULTURE-BLOOD W/SENSITIVITY

## 2018-12-28 ENCOUNTER — Encounter: Admit: 2018-12-28 | Discharge: 2018-12-28

## 2019-03-20 ENCOUNTER — Encounter: Admit: 2019-03-20 | Discharge: 2019-03-20 | Payer: MEDICAID

## 2019-03-20 NOTE — Telephone Encounter
Attempted to reach Pt via telephone to confirm clinic appointment with Carole Civil APRN on 03/23/2019. Female answered preferred number, incorrect number for pt.

## 2019-03-20 NOTE — Telephone Encounter
Attempted to reach pt's emergency contact, Deb. Left VM stating clinic appt is set for 03/23/2019 with A Mikey Bussing, APRN at 10 am. Request call back if pt needs to reschedule.

## 2020-10-07 IMAGING — CT ABDOMEN_PELVIS W(Adult)
2 of 3 series · 13 of 46 positions shown, 15 images · IV contrast (Omnipaque)
Comparison: none

PROCEDURE: ABDOMEN_PELVIS W(Adult)
HISTORY: C/O RUQ PAIN, N/V/D, JAUNDICE. DISCOLORED URINE. H/O IV DRUG USE. H/O APPY.
IUD. CT/NM 0/0 CREAT.-0.76, V6J-CVG
TECHNIQUE: Axial CT imaging of the abdomen and pelvis was performed with IV contrast.
This exam was performed using one or more the following dose reduction techniques:
Automated exposure control, adjustment of the mA and/or KV according to the patient's
size or use of iterative reconstruction technique. Total DLP dose measures 435 mGy with a
total CTDI dose measuring 3 mGy.

[Series 2: abdomen ax 3.00 br40 s3 · axial · 0.56mm/px · z∈[+1098,+1500]mm · 10 of 125 slices shown, 12 images]
[im 9/125  soft-tissue]
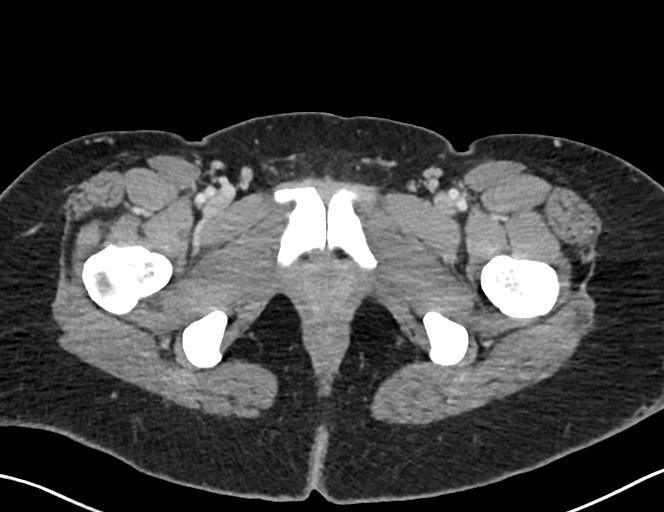
[im 9/125  bone]
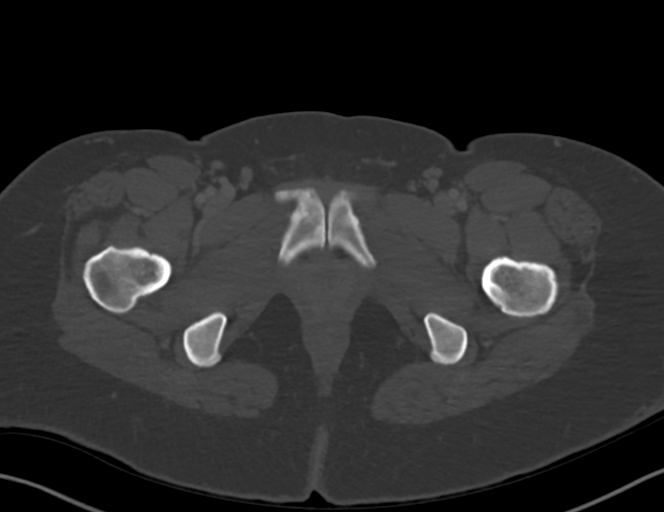
[im 21/125  soft-tissue]
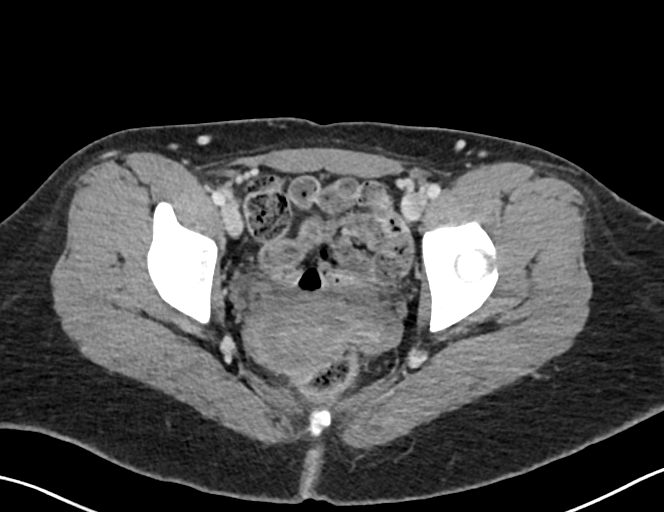
[im 33/125  soft-tissue]
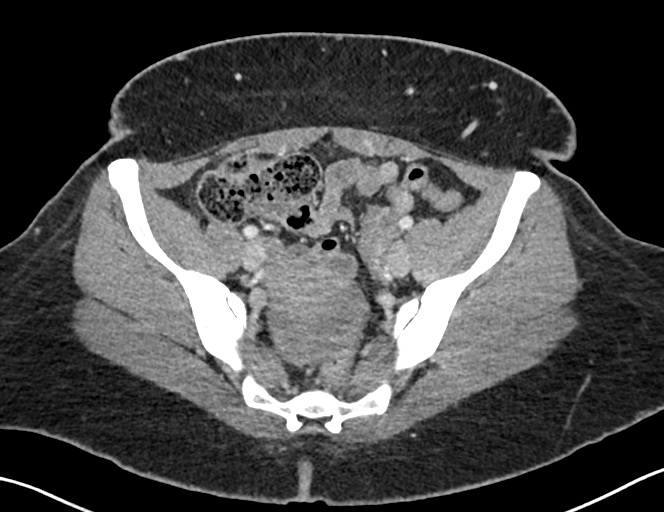
[im 45/125  soft-tissue]
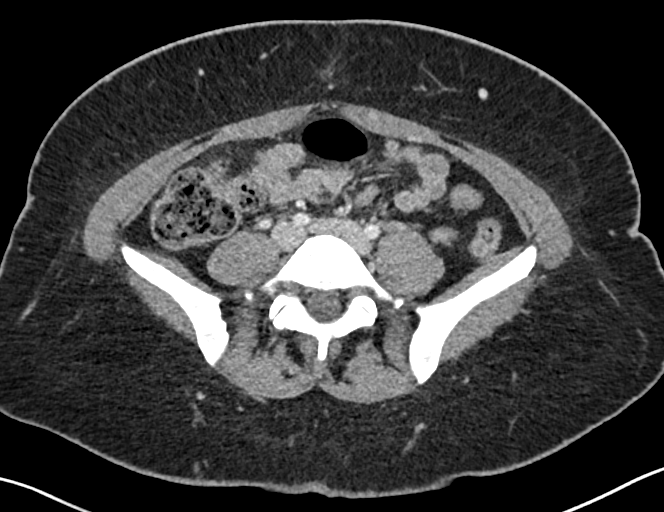
[im 57/125  soft-tissue]
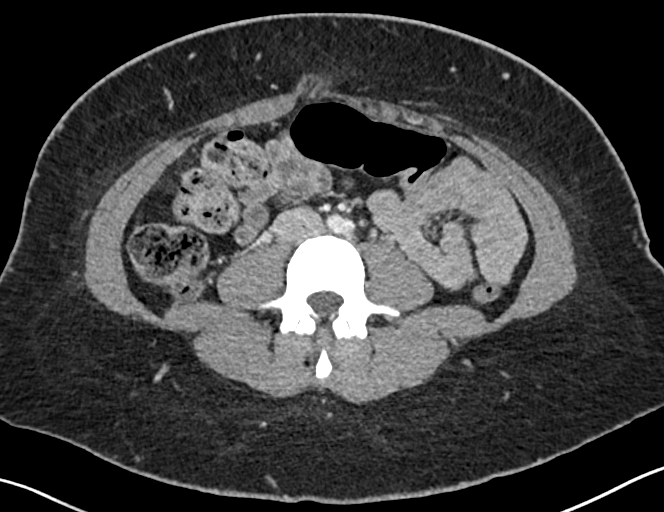
[im 69/125  soft-tissue]
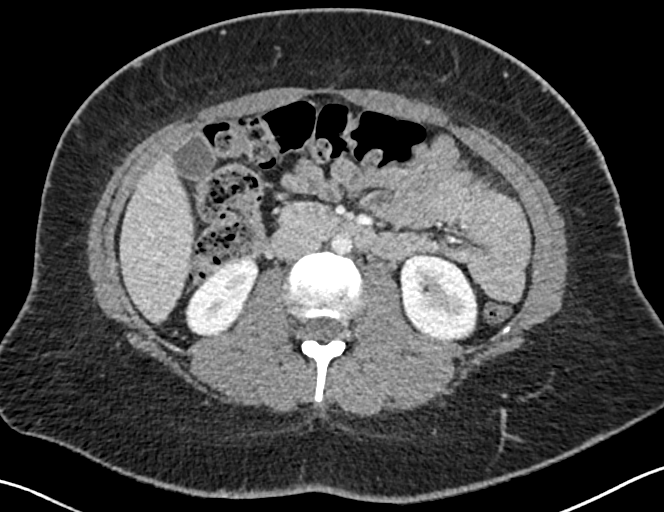
[im 81/125  soft-tissue]
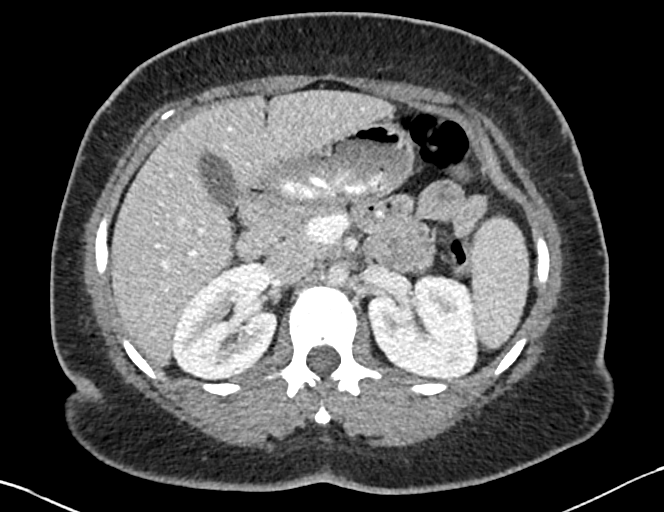
[im 93/125  soft-tissue]
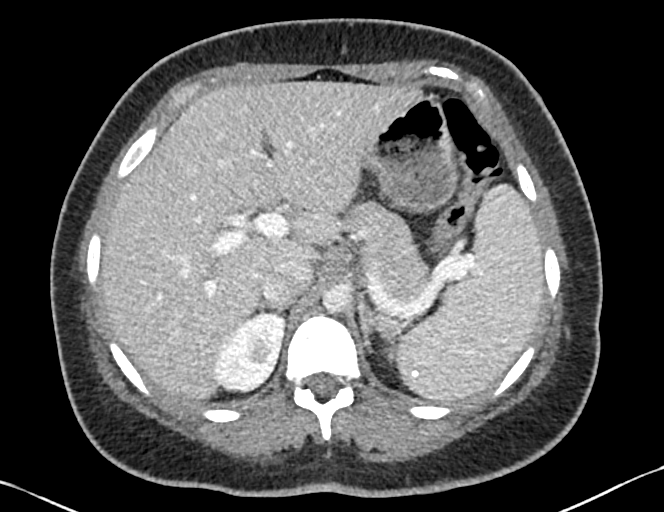
[im 105/125  soft-tissue]
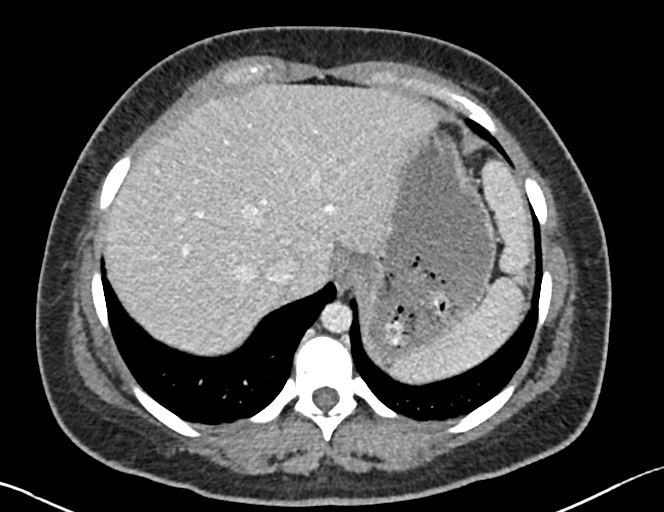
[im 105/125  bone]
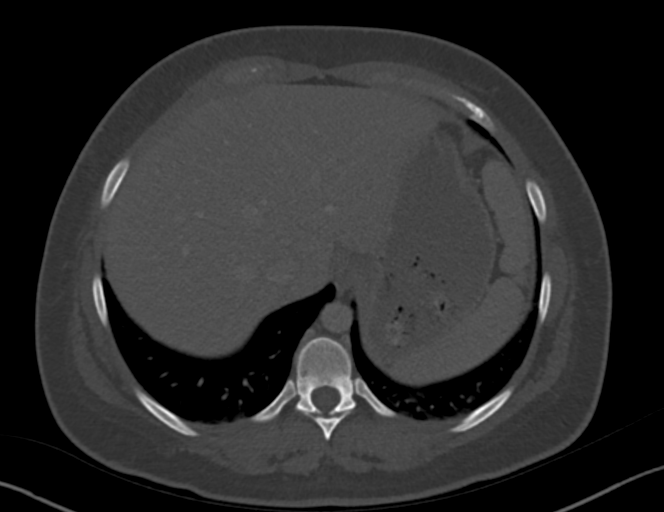
[im 117/125  soft-tissue]
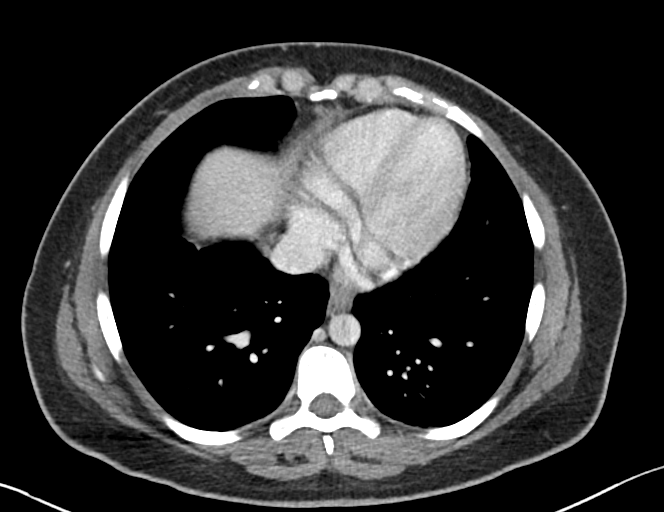

[Series 4: abdomen cor 3.00 br40 s3 · coronal · 0.73mm/px · 3 of 20 slices shown]
[im 7/20  soft-tissue]
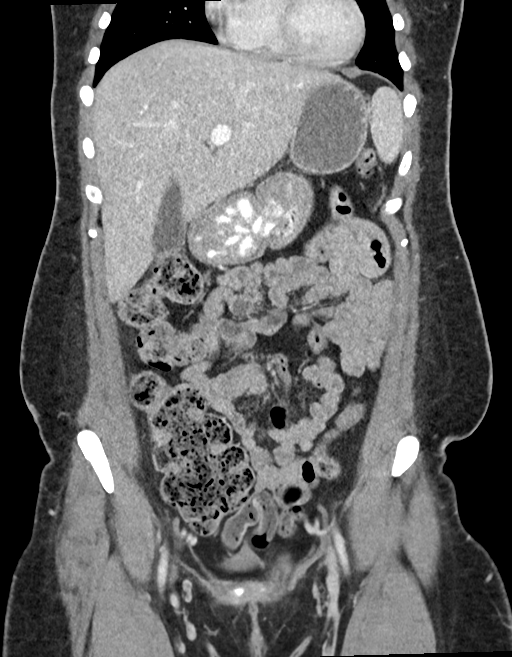
[im 9/20  soft-tissue]
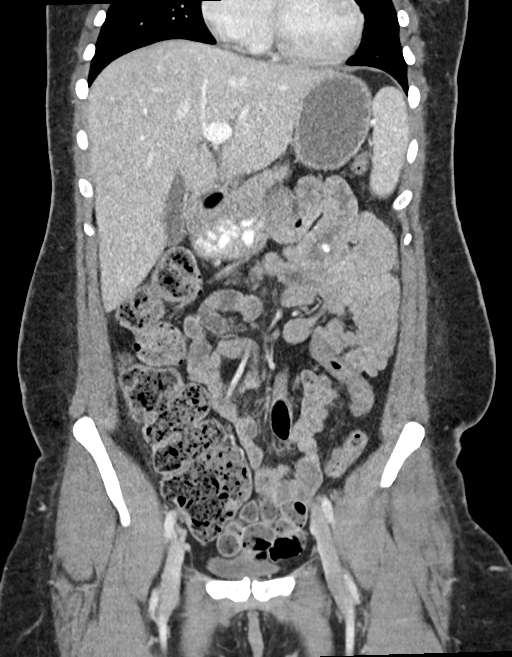
[im 11/20  soft-tissue]
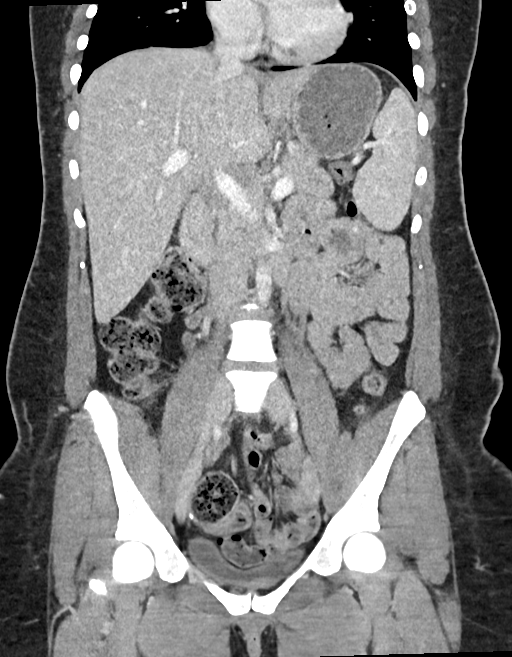

[13 of 46 positions shown; findings below may reference images not displayed]

FINDINGS: The lung bases are clear. Particulate matter is seen within the stomach. The liver, spleen,
gallbladder, pancreas, and adrenals are within normal limits. There is no intrahepatic ductal
biliary dilatation seen. No mesenteric or retroperitoneal adenopathy is seen. The right
kidney shows no hydronephrosis or perinephric fat stranding. The left kidney shows no
hydronephrosis or perinephric fat stranding. The aorta is normal in course and caliber. No
pathologically dilated loops of large or small bowel are seen. Surgical changes are seen
reflecting appendectomy. There is no free air or free fluid seen in the abdomen. The
contrasted urinary bladder is unremarkable. Uterus and ovaries are present with
intrauterine device within the uterus.
IMPRESSION: 1. There is no hydronephrosis or perinephric fat stranding seen.
2. Nonobstructive bowel gas pattern without free air.

Tech Notes:

C/O RUQ PAIN, N/V/D, JAUNDICE. DISCOLORED URINE. H/O IV DRUG USE. H/O APPY. IUD. CT/NM 0/0 CREAT.-
0.76, V6J-CVG
